# Patient Record
Sex: Female | Born: 1975 | Race: White | Hispanic: No | Marital: Married | Smoking: Never smoker
Health system: Southern US, Community
[De-identification: ages and names within clinical notes are randomized; demographics above are authoritative.]

---

## 2013-08-21 ENCOUNTER — Ambulatory Visit
Admission: RE | Admit: 2013-08-21 | Discharge: 2013-08-21 | Disposition: A | Discharge status: Home / Self Care | Payer: BC Managed Care – PPO | Source: Ambulatory Visit | Attending: Family Medicine | Admitting: Family Medicine

## 2013-08-21 ENCOUNTER — Other Ambulatory Visit: Payer: Self-pay | Admitting: Family Medicine

## 2013-08-21 DIAGNOSIS — M79651 Pain in right thigh: Secondary | ICD-10-CM

## 2013-08-21 DIAGNOSIS — M25551 Pain in right hip: Secondary | ICD-10-CM

## 2015-03-26 ENCOUNTER — Other Ambulatory Visit: Payer: Self-pay | Admitting: Family Medicine

## 2015-03-26 ENCOUNTER — Other Ambulatory Visit (HOSPITAL_COMMUNITY)
Admission: RE | Admit: 2015-03-26 | Discharge: 2015-03-26 | Disposition: A | Discharge status: Home / Self Care | Payer: BLUE CROSS/BLUE SHIELD | Source: Ambulatory Visit | Attending: Pediatrics | Admitting: Pediatrics

## 2015-03-26 DIAGNOSIS — Z01411 Encounter for gynecological examination (general) (routine) with abnormal findings: Secondary | ICD-10-CM | POA: Diagnosis present

## 2015-03-26 DIAGNOSIS — Z1151 Encounter for screening for human papillomavirus (HPV): Secondary | ICD-10-CM | POA: Diagnosis present

## 2015-03-29 LAB — CYTOLOGY - PAP

## 2015-05-14 DIAGNOSIS — Z3042 Encounter for surveillance of injectable contraceptive: Secondary | ICD-10-CM | POA: Diagnosis not present

## 2015-07-17 DIAGNOSIS — R030 Elevated blood-pressure reading, without diagnosis of hypertension: Secondary | ICD-10-CM | POA: Diagnosis not present

## 2015-07-17 DIAGNOSIS — N390 Urinary tract infection, site not specified: Secondary | ICD-10-CM | POA: Diagnosis not present

## 2015-08-06 DIAGNOSIS — Z3042 Encounter for surveillance of injectable contraceptive: Secondary | ICD-10-CM | POA: Diagnosis not present

## 2015-10-15 DIAGNOSIS — Z3042 Encounter for surveillance of injectable contraceptive: Secondary | ICD-10-CM | POA: Diagnosis not present

## 2015-12-24 DIAGNOSIS — Z3042 Encounter for surveillance of injectable contraceptive: Secondary | ICD-10-CM | POA: Diagnosis not present

## 2016-01-28 DIAGNOSIS — F411 Generalized anxiety disorder: Secondary | ICD-10-CM | POA: Diagnosis not present

## 2016-02-06 IMAGING — CR DG FEMUR 2+V*R*
4 series · 4 of 4 positions shown · non-contrast
Comparison: Right hip same day

CLINICAL DATA: RIGHT THIGH PAIN X 6 WEEKS  NO TRUAMA

EXAM:
RIGHT FEMUR - 2 VIEW

[view not recorded (1 of 4)]
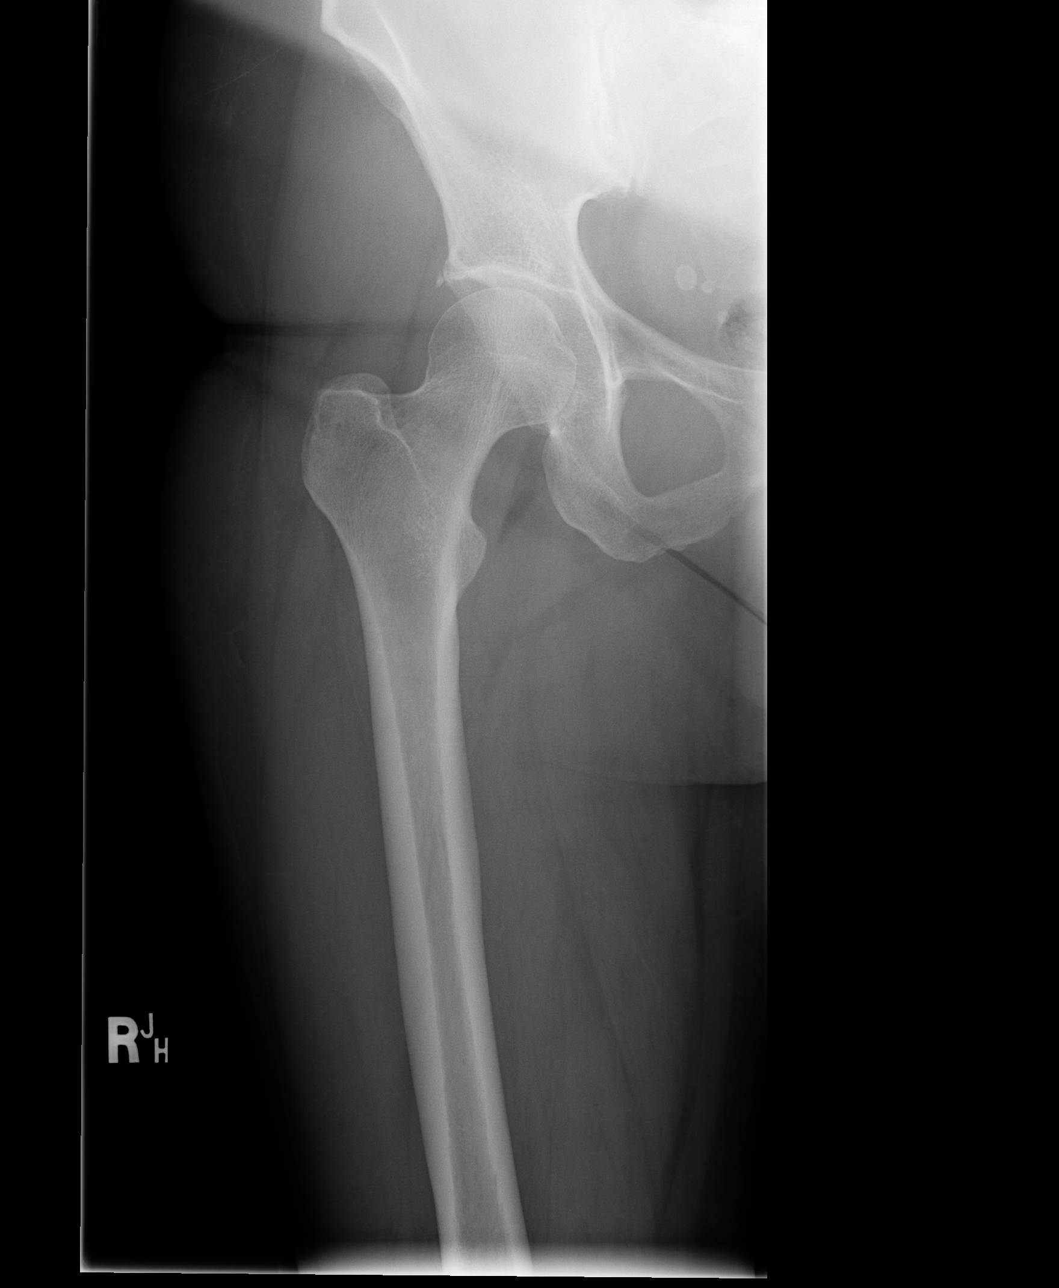

[view not recorded (2 of 4)]
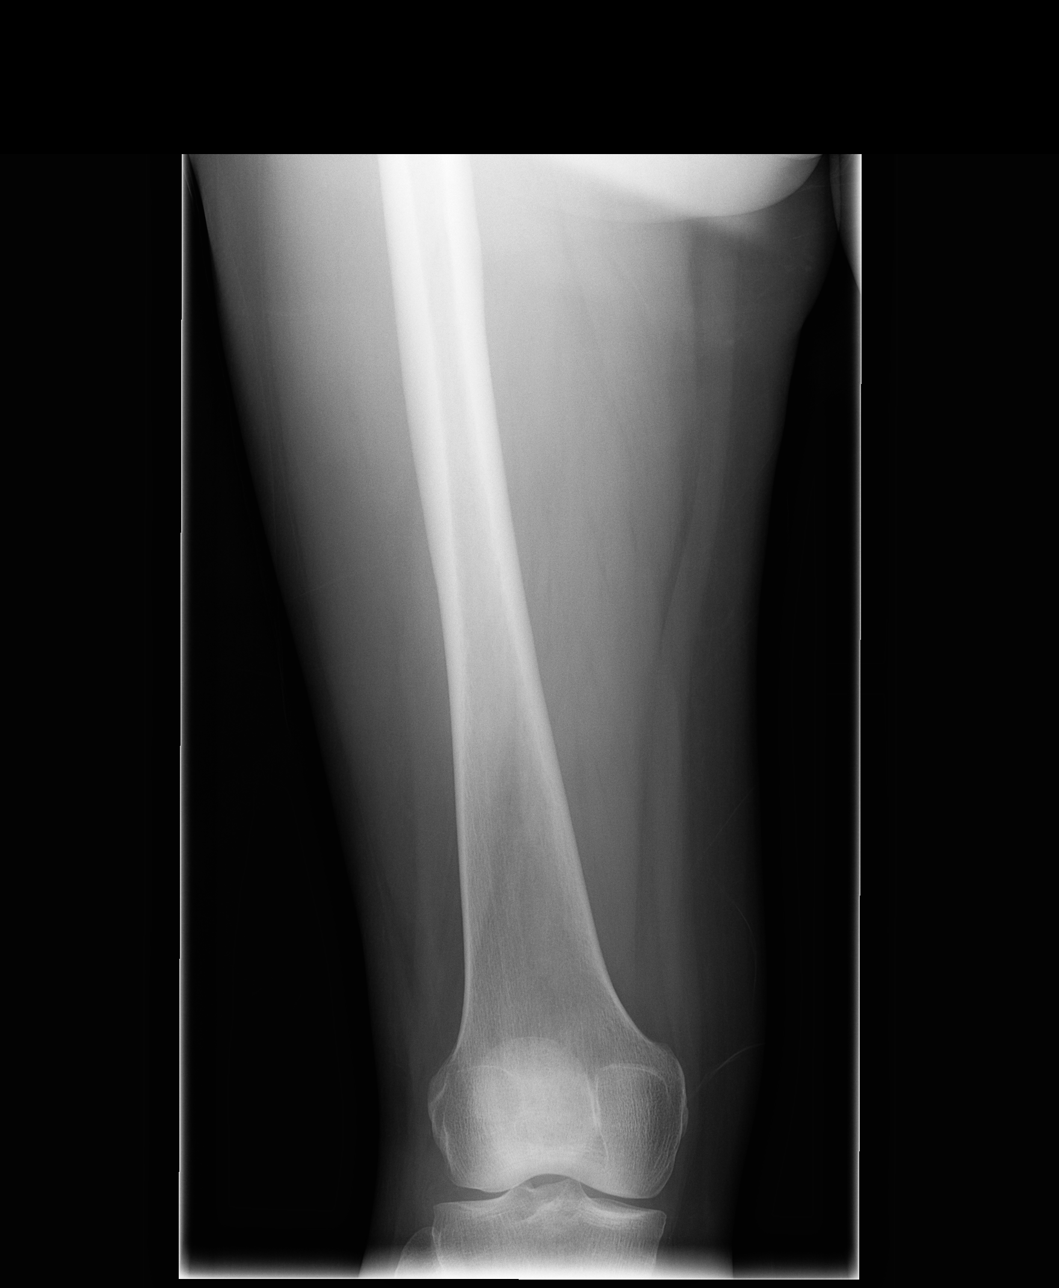

[view not recorded (3 of 4)]
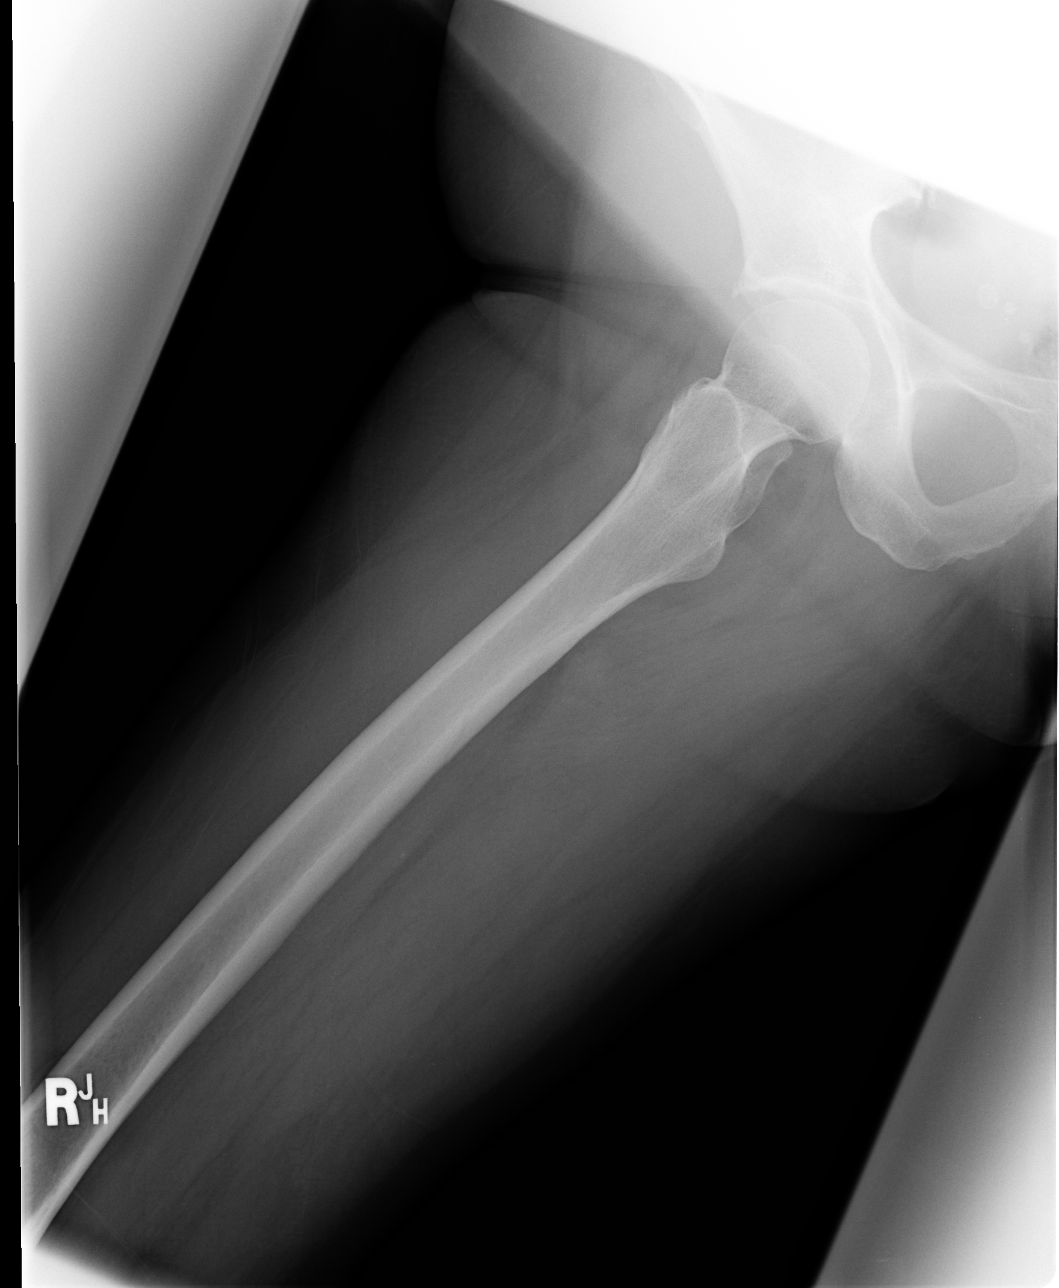

[view not recorded (4 of 4)]
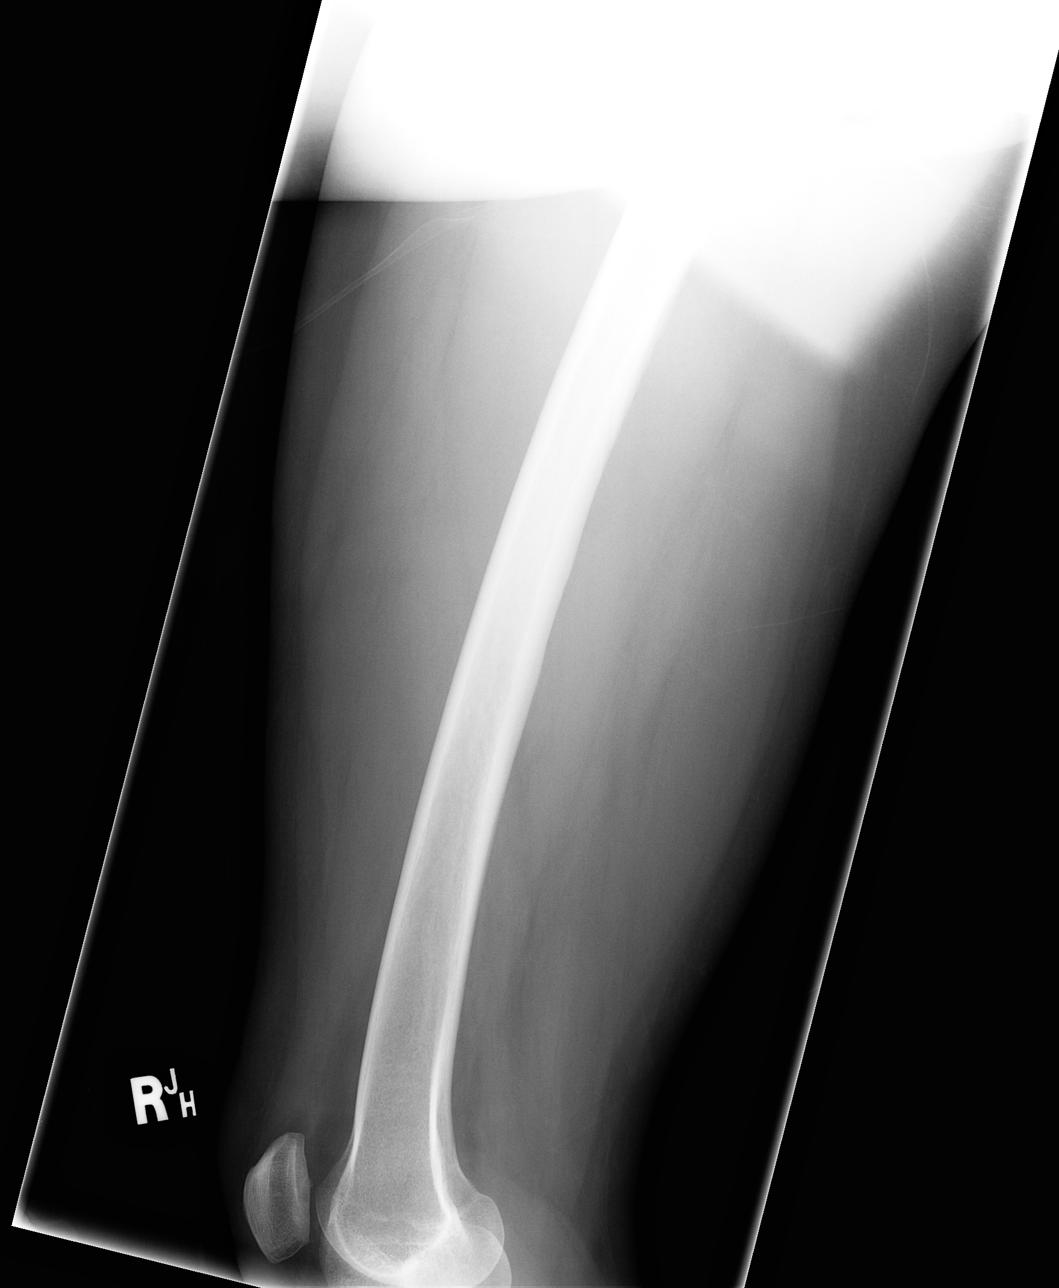

[4 of 4 positions shown; findings below may reference images not displayed]

FINDINGS: There is no evidence of fracture or other focal bone lesions. Soft
tissues are unremarkable.
IMPRESSION: Negative.

## 2016-03-06 DIAGNOSIS — Z3042 Encounter for surveillance of injectable contraceptive: Secondary | ICD-10-CM | POA: Diagnosis not present

## 2016-06-05 DIAGNOSIS — Z3042 Encounter for surveillance of injectable contraceptive: Secondary | ICD-10-CM | POA: Diagnosis not present

## 2016-08-18 DIAGNOSIS — Z3042 Encounter for surveillance of injectable contraceptive: Secondary | ICD-10-CM | POA: Diagnosis not present

## 2016-10-27 DIAGNOSIS — Z3042 Encounter for surveillance of injectable contraceptive: Secondary | ICD-10-CM | POA: Diagnosis not present

## 2017-01-22 DIAGNOSIS — Z3042 Encounter for surveillance of injectable contraceptive: Secondary | ICD-10-CM | POA: Diagnosis not present

## 2017-02-08 DIAGNOSIS — R300 Dysuria: Secondary | ICD-10-CM | POA: Diagnosis not present

## 2017-03-10 DIAGNOSIS — R399 Unspecified symptoms and signs involving the genitourinary system: Secondary | ICD-10-CM | POA: Diagnosis not present

## 2017-04-05 DIAGNOSIS — Z3042 Encounter for surveillance of injectable contraceptive: Secondary | ICD-10-CM | POA: Diagnosis not present

## 2017-05-19 DIAGNOSIS — Z91018 Allergy to other foods: Secondary | ICD-10-CM | POA: Diagnosis not present

## 2017-05-19 DIAGNOSIS — I10 Essential (primary) hypertension: Secondary | ICD-10-CM | POA: Diagnosis not present

## 2017-06-15 DIAGNOSIS — Z3042 Encounter for surveillance of injectable contraceptive: Secondary | ICD-10-CM | POA: Diagnosis not present

## 2017-08-31 DIAGNOSIS — Z3042 Encounter for surveillance of injectable contraceptive: Secondary | ICD-10-CM | POA: Diagnosis not present

## 2017-10-12 DIAGNOSIS — R399 Unspecified symptoms and signs involving the genitourinary system: Secondary | ICD-10-CM | POA: Diagnosis not present

## 2017-10-26 DIAGNOSIS — L84 Corns and callosities: Secondary | ICD-10-CM | POA: Diagnosis not present

## 2017-11-09 DIAGNOSIS — Z3042 Encounter for surveillance of injectable contraceptive: Secondary | ICD-10-CM | POA: Diagnosis not present

## 2017-11-10 DIAGNOSIS — B079 Viral wart, unspecified: Secondary | ICD-10-CM | POA: Diagnosis not present

## 2017-11-10 DIAGNOSIS — M79672 Pain in left foot: Secondary | ICD-10-CM | POA: Diagnosis not present

## 2017-11-26 DIAGNOSIS — B079 Viral wart, unspecified: Secondary | ICD-10-CM | POA: Diagnosis not present

## 2017-12-20 DIAGNOSIS — B079 Viral wart, unspecified: Secondary | ICD-10-CM | POA: Diagnosis not present

## 2018-01-21 DIAGNOSIS — Z3042 Encounter for surveillance of injectable contraceptive: Secondary | ICD-10-CM | POA: Diagnosis not present

## 2018-02-04 DIAGNOSIS — N390 Urinary tract infection, site not specified: Secondary | ICD-10-CM | POA: Diagnosis not present

## 2018-02-28 DIAGNOSIS — R030 Elevated blood-pressure reading, without diagnosis of hypertension: Secondary | ICD-10-CM | POA: Diagnosis not present

## 2018-02-28 DIAGNOSIS — N390 Urinary tract infection, site not specified: Secondary | ICD-10-CM | POA: Diagnosis not present

## 2018-02-28 DIAGNOSIS — J019 Acute sinusitis, unspecified: Secondary | ICD-10-CM | POA: Diagnosis not present

## 2018-04-08 DIAGNOSIS — Z3042 Encounter for surveillance of injectable contraceptive: Secondary | ICD-10-CM | POA: Diagnosis not present

## 2018-05-13 DIAGNOSIS — Z20828 Contact with and (suspected) exposure to other viral communicable diseases: Secondary | ICD-10-CM | POA: Diagnosis not present

## 2018-05-27 DIAGNOSIS — E7409 Other glycogen storage disease: Secondary | ICD-10-CM | POA: Diagnosis not present

## 2018-05-27 DIAGNOSIS — W57XXXA Bitten or stung by nonvenomous insect and other nonvenomous arthropods, initial encounter: Secondary | ICD-10-CM | POA: Diagnosis not present

## 2018-06-29 DIAGNOSIS — K580 Irritable bowel syndrome with diarrhea: Secondary | ICD-10-CM | POA: Diagnosis not present

## 2018-06-29 DIAGNOSIS — I10 Essential (primary) hypertension: Secondary | ICD-10-CM | POA: Diagnosis not present

## 2018-08-26 DIAGNOSIS — Z3042 Encounter for surveillance of injectable contraceptive: Secondary | ICD-10-CM | POA: Diagnosis not present

## 2018-09-22 DIAGNOSIS — L259 Unspecified contact dermatitis, unspecified cause: Secondary | ICD-10-CM | POA: Diagnosis not present

## 2018-09-22 DIAGNOSIS — K580 Irritable bowel syndrome with diarrhea: Secondary | ICD-10-CM | POA: Diagnosis not present

## 2018-09-22 DIAGNOSIS — F419 Anxiety disorder, unspecified: Secondary | ICD-10-CM | POA: Diagnosis not present

## 2018-10-05 DIAGNOSIS — F419 Anxiety disorder, unspecified: Secondary | ICD-10-CM | POA: Diagnosis not present

## 2018-11-04 DIAGNOSIS — Z3042 Encounter for surveillance of injectable contraceptive: Secondary | ICD-10-CM | POA: Diagnosis not present

## 2018-11-14 DIAGNOSIS — G4700 Insomnia, unspecified: Secondary | ICD-10-CM | POA: Diagnosis not present

## 2018-11-14 DIAGNOSIS — N3010 Interstitial cystitis (chronic) without hematuria: Secondary | ICD-10-CM | POA: Diagnosis not present

## 2018-11-14 DIAGNOSIS — I10 Essential (primary) hypertension: Secondary | ICD-10-CM | POA: Diagnosis not present

## 2018-11-14 DIAGNOSIS — F411 Generalized anxiety disorder: Secondary | ICD-10-CM | POA: Diagnosis not present

## 2018-11-30 DIAGNOSIS — I10 Essential (primary) hypertension: Secondary | ICD-10-CM | POA: Diagnosis not present

## 2018-11-30 DIAGNOSIS — F419 Anxiety disorder, unspecified: Secondary | ICD-10-CM | POA: Diagnosis not present

## 2018-11-30 DIAGNOSIS — Z9119 Patient's noncompliance with other medical treatment and regimen: Secondary | ICD-10-CM | POA: Diagnosis not present

## 2020-02-22 ENCOUNTER — Telehealth: Payer: Self-pay | Admitting: Medical

## 2020-02-22 NOTE — Telephone Encounter (Signed)
Requested records received from [facility]

## 2020-03-06 ENCOUNTER — Other Ambulatory Visit: Payer: Self-pay

## 2020-03-06 ENCOUNTER — Encounter: Payer: Self-pay | Admitting: Family Medicine

## 2020-03-06 ENCOUNTER — Telehealth (INDEPENDENT_AMBULATORY_CARE_PROVIDER_SITE_OTHER): Payer: 59 | Admitting: Family Medicine

## 2020-03-06 VITALS — BP 168/120 | HR 100 | Wt 125.0 lb

## 2020-03-06 DIAGNOSIS — R030 Elevated blood-pressure reading, without diagnosis of hypertension: Secondary | ICD-10-CM

## 2020-03-06 DIAGNOSIS — F411 Generalized anxiety disorder: Secondary | ICD-10-CM | POA: Diagnosis not present

## 2020-03-06 DIAGNOSIS — F429 Obsessive-compulsive disorder, unspecified: Secondary | ICD-10-CM | POA: Insufficient documentation

## 2020-03-06 DIAGNOSIS — J019 Acute sinusitis, unspecified: Secondary | ICD-10-CM | POA: Diagnosis not present

## 2020-03-06 DIAGNOSIS — F1020 Alcohol dependence, uncomplicated: Secondary | ICD-10-CM

## 2020-03-06 MED ORDER — AMOXICILLIN-POT CLAVULANATE 875-125 MG PO TABS: 1.0000 | ORAL_TABLET | Freq: Two times a day (BID) | ORAL | 0 refills | Status: DC

## 2020-03-06 NOTE — Patient Instructions (Signed)
You can call to schedule your appointment with the psychiatrist/counselor. A few offices are listed below for you to call.    [facility]  Ask for a psychiatrist  [address]  (across from [facility])  [phone]    [facility] [address] [place], [place] [address] [phone]   [facility]  [address], [place], [place] [address]  Phone: [phone]   [facility] [address] [place], [place] [address]  Phone: [phone] 

## 2020-03-06 NOTE — Progress Notes (Signed)
Subjective:  Documentation for virtual audio and video telecommunications through Caregility encounter:  The patient was located at home. 2 patient identifiers used.  The provider was located in the office. The patient did consent to this visit and is aware of possible charges through their insurance for this visit.  The other persons participating in this telemedicine service were none. Time spent on call was 45 minutes and in review of previous records >45 minutes total.  This virtual service is not related to other E/M service within previous 7 days.   Patient ID: Nicole Lowe, female    DOB: Feb 06, 1975, 45 y.o.   MRN: [id number]  HPI . Chief Complaint  Patient presents with  . new acute cold   . Nasal Congestion  . Sinus Problem  . Fatigue  . Generalized Body Aches   She is new to the practice and here to establish care.  The visit was originally scheduled as an in office visit but changed based on her current symptoms in the setting of Covid 19 pandemic.   Previous medical care: [facility]   Other providers: None   Complains of a 2 week history of fatigue, body aches, sinus pressure, nasal congestion, frontal headache, and watery eyes.  States she thinks she has a sinus infection.  States she needs an antibiotic and would like a steroid Dosepak as well. Last sinus infection was last year.  Mild underlying allergies.   She has taken Dayquil.  She declines Covid testing.  States she had Covid approximately 1 year ago and she was very sick.  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   Reports history of anxiety, panic attacks and OCD. States she has been taking alprazolam daily for years and recently started on buspirone.  States she is happy with how she is currently taking her medications. States she is in a good place  mentally and emotionally. She has been taking alprazolam 1.5 mg twice daily.  States she has been taking this dose for at least 2 years.  States she used to be on an SSRI and refuses to to be on an antidepressant again.  States she does not like how they make her feel. She repeatedly verbalizes that she does not want to be on a antidepressant.  She also admits to drinking 3-4 alcoholic drinks per night.  States she is "cutting back"  States she has seen a therapist in the past and is not currently involved in counseling.  She has never seen a psychiatrist.  States she lost her 2 pets last year and she is still struggling with this emotionally.  She does have a new dog now.  Blood pressure significantly elevated per patient.  She did not come in the office for [place] to check this today.  She does not want to discuss this today.  Denies dizziness, vision changes, chest pain, palpitations, shortness of breath.  She is getting Depo Provera injections for contraception.  She would like to come in for this when she is due.  History of alpha gal    Social history: Lives with spouse, works as Community education officer  Denies smoking and stopped 20 years ago   Reviewed allergies, medications, past medical, surgical, family, and social history.    Review of Systems Pertinent positives and negatives in the history of present illness.     Objective:  Physical Exam BP (!) 168/120   Pulse 100   Wt 125 lb (56.7 kg)   Alert and oriented and in no acute distress.  Respirations unlabored.  Speaking in complete senses without difficulty.  Reports tenderness to palpation to her maxillary and ethmoid sinuses.  Normal speech.  Her mood did become anxious when discussing her medication regimen.      Assessment & Plan:  GAD (generalized anxiety disorder) -She is new to the practice and here today with several complaints including an acute illness, suspected sinusitis. Her visit was switched to a  virtual. She reportedly has a long history of anxiety disorder, panic attacks and OCD.  She also reports being on alprazolam daily for years.  She is also using this for sleep. Discussed that I will not continue her alprazolam prescription as it is and that I recommend that she begin to wean off of this medication.  Discussed potential harmful interaction with alprazolam and her daily alcohol use.  Discussed potential long-term health consequences associated with daily benzodiazepine use.  I provided her with options including seeing a psychiatrist or finding a new PCP. I agreed to help her wean off of the alprazolam if she follows up with me.  She then reported that she recently established with a new PCP and they refused to prescribe her alprazolam at all and wanted her to see a psychiatrist but she refused.   States she is willing to check into finding a psychiatrist.  I provided her with a list. She repeatedly tell me how she feels comfortable with her current medication regimen and that she would not be interested in taking a daily antidepressant.  In-depth counseling done on why she should wean off of the alprazolam and why she should not use this medication for sleep along with the alcohol. If she follows up with me then, she is aware that her prescription of alprazolam will be reduced going forward.  Review of PDMP shows that she received a new prescription from her previous PCP on March 03, 2020. No prescriptions provided today except for an antibiotic. I am not certain that she will follow-up with me but if she does, she is aware of my plan to wean her off of the alprazolam   Obsessive-compulsive disorder, unspecified type -Referral to psychiatry.  I also recommend counseling.  Acute non-recurrent sinusitis, unspecified location - Plan: amoxicillin-clavulanate (AUGMENTIN) 875-125 MG tablet -Declines Covid testing.  I will treat her for acute sinusitis.  She requests a steroid Dosepak  however, discussed that I do not think this is necessary at this point.  I will prescribe an antibiotic for her.  Discussed symptomatic treatment including Mucinex, good hydration.  She may consider nasal spray or decongestant as well if needed.  Elevated blood pressure reading -Unclear as to what her blood pressure would be in the office.  This will need follow-up as soon as she is feeling better.  She did not want to discuss this today.  Alcohol dependence, daily use (HCC) -She reports that she is in the process of cutting back.  Discussed that alcohol can negatively affect her mood as well as having a negative interaction with alprazolam and BuSpar.  More than 45 minutes was spent on a virtual audio and video call with patient and 100% of this time was spent in counseling and coordination of care.  I also spent time reviewing medical records from her previous PCP.

## 2020-03-27 ENCOUNTER — Telehealth: Payer: Self-pay | Admitting: Family Medicine

## 2020-03-27 ENCOUNTER — Other Ambulatory Visit: Payer: Self-pay | Admitting: Family Medicine

## 2020-03-27 NOTE — Telephone Encounter (Signed)
I reviewed her chart and my notes from our visit to see how I could help her and found that she picked up a prescription of alprazolam 90 tablets on 03/03/2020 and it appears that she was prescribed more alprazolam on 03/12/2020 from a different PCP at [facility]. Unfortunately, I am unable to prescribe anything else for her today. Did she find a Veterinary surgeon or psychiatrist as we discussed? I provided her with a list. In order for her to get immediate help today, I recommend she go to the [facility] on [address] or the [facility] ED to be evaluated. Thanks.

## 2020-03-27 NOTE — Telephone Encounter (Signed)
Pt called having panic attack, I talked with her for 20 minutes, she said it felt like her heart was going to jump out of her chest. Her husband is also having depression issues.  They lost two dogs within 6 month.  She was at [facility] walking around.  Pt wants to know if you can call her in something this evening to help calm her down.  [facility] [address] 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  Pt ph [phone]

## 2020-03-27 NOTE — Telephone Encounter (Signed)
Called pt back, reached voice mail. LMTRC
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
## 2020-04-02 ENCOUNTER — Other Ambulatory Visit (INDEPENDENT_AMBULATORY_CARE_PROVIDER_SITE_OTHER): Payer: 59

## 2020-04-02 ENCOUNTER — Other Ambulatory Visit: Payer: Self-pay

## 2020-04-02 DIAGNOSIS — Z309 Encounter for contraceptive management, unspecified: Secondary | ICD-10-CM

## 2020-04-02 MED ORDER — MEDROXYPROGESTERONE ACETATE 150 MG/ML IM SUSP
150.0000 mg | Freq: Once | INTRAMUSCULAR | Status: AC
Start: 2020-04-02 — End: 2020-04-02
  Administered 2020-04-02: 150 mg via INTRAMUSCULAR

## 2020-06-25 ENCOUNTER — Other Ambulatory Visit: Payer: 59

## 2021-03-12 ENCOUNTER — Ambulatory Visit: Payer: 59 | Admitting: Podiatry

## 2021-03-12 ENCOUNTER — Ambulatory Visit (INDEPENDENT_AMBULATORY_CARE_PROVIDER_SITE_OTHER): Payer: 59

## 2021-03-12 ENCOUNTER — Other Ambulatory Visit: Payer: Self-pay

## 2021-03-12 DIAGNOSIS — M21612 Bunion of left foot: Secondary | ICD-10-CM

## 2021-03-12 DIAGNOSIS — M21619 Bunion of unspecified foot: Secondary | ICD-10-CM

## 2021-03-12 DIAGNOSIS — M21611 Bunion of right foot: Secondary | ICD-10-CM | POA: Diagnosis not present

## 2021-03-12 DIAGNOSIS — M7751 Other enthesopathy of right foot: Secondary | ICD-10-CM

## 2021-03-12 MED ORDER — TRIAMCINOLONE ACETONIDE 10 MG/ML IJ SUSP
10.0000 mg | Freq: Once | INTRAMUSCULAR | Status: AC
  Administered 2021-03-12: 10 mg

## 2021-03-12 NOTE — Patient Instructions (Signed)

## 2021-03-13 NOTE — Progress Notes (Signed)
Subjective:  ? ?Patient ID: Nicole Lowe, female   DOB: 46 y.o.   MRN: [id number]  ? ?HPI ?Patient states that she has developed progressive pain around her big toe joint right over left over the last approximate 1 year.  It is worse with certain activities especially hiking and certain types of shoes and also develops callus formation.  She knows she has bunions and does have family history and does not smoke likes to be active ? ? ?Review of Systems  ?All other systems reviewed and are negative. ? ? ?   ?Objective:  ?Physical Exam ?Vitals and nursing note reviewed.  ?Constitutional:   ?   Appearance: She is well-developed.  ?Pulmonary:  ?   Effort: Pulmonary effort is normal.  ?Musculoskeletal:     ?   General: Normal range of motion.  ?Skin: ?   General: Skin is warm.  ?Neurological:  ?   Mental Status: She is alert.  ?  ?Neurovascular status was found to be intact muscle strength was found to be adequate range of motion within normal limits.  Patient does have structural HAV deformity right over left with redness around the joint and on the right when there is more swelling dorsally with mild limitation of motion but no crepitus of the joint.  Patient is found to have good digital perfusion well oriented x3 ? ?   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 ?   ?Plan:  ?H&P reviewed condition and educated her on bunion deformity and also the possibility for low-grade arthritis.  At this point, to try to reduce inflammation and I did advise at 1 point this bunion should be fixed and I do not want to wait forever and hopefully we can avoid more cartilage damage.  Today I did a periarticular injection 3 mg Kenalog 5 mg Xylocaine around the first MPJ and I want to check her back again in 1 month and reevaluate.  Patient will wear rigid bottom shoes as best as  possible and can do her hiking and other activities at this time ? ?X-rays indicate structural bunion deformity right over left with signs of arthritis of the mild structural deformity right over left dorsally and within the joint of the MPJ ?   ? ? ?

## 2021-03-20 ENCOUNTER — Other Ambulatory Visit: Payer: Self-pay

## 2021-03-20 ENCOUNTER — Ambulatory Visit: Payer: 59 | Admitting: Podiatry

## 2021-03-20 DIAGNOSIS — M205X1 Other deformities of toe(s) (acquired), right foot: Secondary | ICD-10-CM | POA: Diagnosis not present

## 2021-03-20 DIAGNOSIS — M21611 Bunion of right foot: Secondary | ICD-10-CM | POA: Diagnosis not present

## 2021-03-20 NOTE — Progress Notes (Signed)
Subjective:  ? ?Patient ID: Nicole Lowe, female   DOB: 46 y.o.   MRN: [id number]  ? ?HPI ?Patient presents stating her big toe joint did great after we did the injection for several days but then the pain started back and is now returned to the way it was and while she noticed a reduction in pain in all her other areas it also returned in the outside of the foot and the top of the foot.  Patient is interested in trying to do something to correct this as she did have a big improvement over that.  Of time of those few days ? ? ?ROS ? ? ?   ?Objective:  ?Physical Exam  ?Neurovascular status found to be intact with the patient found to have reduced range of motion of the first MPJ structural bunion deformity and pain of the first MPJ.  She also has mild discomfort lateral around the peroneal groove and into the sinus tarsi with slight dorsal pain but those are not to the same degree of what she is experiencing in the big toe joint and is most likely compensating for that pain ? ?   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 ?   ?Plan:  ?H&P x-rays reviewed and condition discussed with Nicole Lowe at great length.  I did discuss correcting the bunion evaluating the cartilage removal of dorsal spurring and also somewhat lowering and shortening the first metatarsal with osteotomy.  Patient wants to pursue this and at this point I allowed her to read consent form going over alternative treatments complications.  Patient understand there is absolutely no long-term guarantees that this will solve the problem that ultimately she may require fusion or joint implantation and that the outside pain is most likely compensatory in nature and I am hopeful it will go away but there is no guarantee that pain will go away.  She understands all of this she wants surgery signed  consent form and is scheduled for a osteotomy first metatarsal right with removal of bone spurs and everything was discussed with her and I also reviewed with her recovery of approximate 6 months for complete recovery.  Air fracture walker dispensed today and patient will start wearing this at home to get used to it prior to surgery and find a shoe on the other foot that fits well.  Patient is scheduled for outpatient surgery all questions answered and is given her date ?   ? ? ?

## 2021-03-26 ENCOUNTER — Other Ambulatory Visit: Payer: Self-pay | Admitting: Sports Medicine

## 2021-03-26 ENCOUNTER — Telehealth: Payer: Self-pay | Admitting: Sports Medicine

## 2021-03-26 MED ORDER — ACETAMINOPHEN-CODEINE #3 300-30 MG PO TABS: 1.0000 | ORAL_TABLET | Freq: Three times a day (TID) | ORAL | 0 refills | Status: DC | PRN

## 2021-03-26 NOTE — Telephone Encounter (Signed)
Patient is calling in requesting pain medication , she is in a lot of pain, wants to know if you can call her in a high dose of Tylenol? Please advise   [facility] [address]

## 2021-03-26 NOTE — Telephone Encounter (Signed)
Pt returned call and I told her the medication tylenol 3 was called into the pharmacy. She asked about if it persisted on the message to call us and get an appt. ?

## 2021-03-26 NOTE — Telephone Encounter (Signed)
Lmovm that medication was called into pharmacy

## 2021-03-26 NOTE — Progress Notes (Signed)
Tylenol 3 sent for patient for severe pain ?

## 2021-03-31 ENCOUNTER — Telehealth: Payer: Self-pay | Admitting: Urology

## 2021-03-31 NOTE — Telephone Encounter (Signed)
DOS - 04/29/21 ? ?[place] BUNIONECTOMY RIGHT --- [phone] ? ? ?[employer] EFFECTIVE DATE - 01/05/21 ? ? ?RECEIVED FAX FROM [employer] STATING THAT CPT CODE 60454 HAS BEEN APPROVED, AUTH # [id number], GOOD FROM 03/31/21 - 07/01/21.  ?

## 2021-04-02 ENCOUNTER — Other Ambulatory Visit: Payer: Self-pay | Admitting: Sports Medicine

## 2021-04-02 ENCOUNTER — Ambulatory Visit: Payer: 59 | Admitting: Podiatry

## 2021-04-02 ENCOUNTER — Telehealth: Payer: Self-pay | Admitting: Podiatry

## 2021-04-02 NOTE — Telephone Encounter (Signed)
Pt called asking if we could give her refills on her pain medication(Tylenol with codine)  ? ?I explained that a rx was sent in today and we cannot put refills on pain medication she will have to call to get it refilled when she is almost out. ?

## 2021-04-02 NOTE — Telephone Encounter (Signed)
Please advise 

## 2021-04-16 ENCOUNTER — Telehealth: Payer: Self-pay | Admitting: Podiatry

## 2021-04-16 ENCOUNTER — Ambulatory Visit: Payer: 59 | Admitting: Podiatry

## 2021-04-16 NOTE — Telephone Encounter (Signed)
Lmom letting patient know that Dr Paulla Dolly wants her to switch to ibuprofen, that he does not recommend narcotics for long time use.

## 2021-04-16 NOTE — Telephone Encounter (Signed)
Ph[facility] : [facility] [address]   Medication Acetaminophen-Codeine 300-30 MG tablet   Patient is requesting a refill on pain medication.

## 2021-04-16 NOTE — Telephone Encounter (Signed)
Pt called back and is aware that Dr Charlsie Merles recommended taking ibuprofen for now. He does not want her taking narcotics for a long period of time.  ?

## 2021-04-16 NOTE — Telephone Encounter (Signed)
Should be ok with ibuprophen. Do not recommend narcotics for this long of a time

## 2021-04-28 MED ORDER — OXYCODONE-ACETAMINOPHEN 10-325 MG PO TABS
1.0000 | ORAL_TABLET | ORAL | 0 refills | Status: DC | PRN
Start: 2021-04-28 — End: 2021-10-29

## 2021-04-28 MED ORDER — ONDANSETRON HCL 4 MG PO TABS: 4.0000 mg | ORAL_TABLET | Freq: Three times a day (TID) | ORAL | 0 refills | Status: DC | PRN

## 2021-04-28 NOTE — Addendum Note (Signed)
Addended by: Lenn Sink on: 04/28/2021 01:24 PM ? ? Modules accepted: Orders ? ?

## 2021-04-29 ENCOUNTER — Other Ambulatory Visit: Payer: Self-pay | Admitting: Podiatry

## 2021-04-29 ENCOUNTER — Telehealth: Payer: Self-pay | Admitting: Podiatry

## 2021-04-29 ENCOUNTER — Encounter: Payer: Self-pay | Admitting: Podiatry

## 2021-04-29 DIAGNOSIS — M2011 Hallux valgus (acquired), right foot: Secondary | ICD-10-CM | POA: Diagnosis not present

## 2021-04-29 MED ORDER — OXYCODONE-ACETAMINOPHEN 10-325 MG PO TABS
1.0000 | ORAL_TABLET | Freq: Four times a day (QID) | ORAL | 0 refills | Status: DC | PRN
Start: 2021-04-29 — End: 2021-10-29

## 2021-04-29 NOTE — Telephone Encounter (Signed)
Pt called again she had surgery today and has not been able to get her pain medication yet and she needs it tonight her anesthesia is wearing off. ? ?I seen that it said it needed a prior auth and those do take time.. Please let pt know what is going on..... ?

## 2021-04-29 NOTE — Telephone Encounter (Signed)
Called the pharmacy and the insurance will not allow, too many tablets within 24 hour period , instead of q4 hours,q6 hours may qualify to dispense. Please resend script.  ?

## 2021-04-29 NOTE — Telephone Encounter (Signed)
Pt called again regarding her medication. States that her insurance requires a prior authorization for the narcotic. She states that she would like to have the medication so that she is not in pain over night tonight.  ? ?Please advise ?

## 2021-04-29 NOTE — Telephone Encounter (Signed)
Notified pt that Dr Allena Katz is working on getting the rx resent. ?

## 2021-04-29 NOTE — Telephone Encounter (Signed)
Pt husband called about her pain medication (oxye) not approved per pharmacy. Pt in pain. ? ?Please advise. ?

## 2021-04-30 NOTE — Telephone Encounter (Signed)
Called patient and she was able to pick up medicine yesterday after dosage change was made. ?

## 2021-04-30 NOTE — Telephone Encounter (Signed)
Please call her this morning to confirm she received the medicine. Not sure why it requires preauthorization

## 2021-05-02 ENCOUNTER — Telehealth: Payer: Self-pay | Admitting: Podiatry

## 2021-05-02 NOTE — Telephone Encounter (Signed)
Pt states that she is doing well after her surgery. Wants to know if its okay to take her boot off from time to time during the day and night. She has been wearing it all day and night since sx. ? ?Please advise. ?

## 2021-05-02 NOTE — Telephone Encounter (Signed)
That is fine 

## 2021-05-05 ENCOUNTER — Ambulatory Visit (INDEPENDENT_AMBULATORY_CARE_PROVIDER_SITE_OTHER): Payer: 59

## 2021-05-05 ENCOUNTER — Encounter: Payer: 59 | Admitting: Podiatry

## 2021-05-05 ENCOUNTER — Encounter: Payer: Self-pay | Admitting: Podiatry

## 2021-05-05 ENCOUNTER — Ambulatory Visit (INDEPENDENT_AMBULATORY_CARE_PROVIDER_SITE_OTHER): Payer: 59 | Admitting: Podiatry

## 2021-05-05 DIAGNOSIS — M7751 Other enthesopathy of right foot: Secondary | ICD-10-CM | POA: Diagnosis not present

## 2021-05-05 DIAGNOSIS — Z9889 Other specified postprocedural states: Secondary | ICD-10-CM

## 2021-05-07 ENCOUNTER — Encounter: Payer: 59 | Admitting: Podiatry

## 2021-05-07 NOTE — Progress Notes (Signed)
Subjective:  ? ?Patient ID: Nicole Lowe, female   DOB: 46 y.o.   MRN: [id number]  ? ?HPI ?Patient states she is doing very well with her foot very pleased with minimal discomfort able to walk with her boot without pain currently ? ? ?ROS ? ? ?   ?Objective:  ?Physical Exam  ?Neurovascular status intact negative Denna Haggard' sign noted wound edges coapted well range of motion first MPJ good no crepitus of the joint noted ? ?   ?Assessment:  ?Doing well post distal osteotomy first metatarsal right foot ? ?   ?Plan:  ?H&P x-rays reviewed and advised on continued immobilization elevation compression sterile dressing reapplied dispensed surgical shoe explained range of motion exercises.  Reappoint 3 weeks or earlier if needed ? ?X-rays indicate good plantarflexion first metatarsal reduction of IM angle with joint congruence ?   ? ? ?

## 2021-05-08 ENCOUNTER — Other Ambulatory Visit: Payer: Self-pay | Admitting: Podiatry

## 2021-05-08 ENCOUNTER — Telehealth: Payer: Self-pay | Admitting: Podiatry

## 2021-05-08 MED ORDER — OXYCODONE-ACETAMINOPHEN 10-325 MG PO TABS
1.0000 | ORAL_TABLET | ORAL | 0 refills | Status: DC | PRN
Start: 2021-05-08 — End: 2021-10-29

## 2021-05-08 NOTE — Telephone Encounter (Signed)
done

## 2021-05-08 NOTE — Telephone Encounter (Signed)
Pt called to request a refill on RX for Oxycodone. She stated she is still experiencing some pain when walking. She uses the pharmacy on [address]. Please advise ?

## 2021-05-09 ENCOUNTER — Telehealth: Payer: Self-pay | Admitting: *Deleted

## 2021-05-09 ENCOUNTER — Telehealth: Payer: Self-pay | Admitting: Podiatry

## 2021-05-09 NOTE — Telephone Encounter (Signed)
Ammie, please send this to pharmacy

## 2021-05-09 NOTE — Telephone Encounter (Addendum)
Patient called again said to hold off on this until she call the pharmacy and see if they are going to fill the amount that was called in.  If she calls back she is going to need a plan B medication .     Patient wants to know if you can call her in a different type pain medication since her insurance will not fill the other one?

## 2021-05-09 NOTE — Telephone Encounter (Signed)
Called pharmacy and the patient's plan will only cover 30 tablets within 180 days,she has passed her limit.  ?Prior authorization has been resubmitted to [employer], waiting for their response. Is there something else that can be prescribed?  ? ?Called and spoke the [employer] [facility]([employer] for prior authorization dept.). The prescription quantity would need to  be changed from 15 tablets to 12 tablets. It should go thru with no problem. ?

## 2021-05-09 NOTE — Telephone Encounter (Signed)
Good Morning Patient called and stated that she needs a Prior Auth sent to her pharmacy for the oxyCODONE-acetaminophen (PERCOCET) 10-325 MG tablet that was prescribed on 05/08/2021. ? ? ?Please advise  ?

## 2021-05-12 ENCOUNTER — Other Ambulatory Visit: Payer: Self-pay | Admitting: Podiatry

## 2021-05-12 MED ORDER — OXYCODONE-ACETAMINOPHEN 10-325 MG PO TABS: 1.0000 | ORAL_TABLET | ORAL | 0 refills | Status: DC | PRN

## 2021-05-12 NOTE — Telephone Encounter (Signed)
Called in 12

## 2021-05-13 ENCOUNTER — Telehealth: Payer: Self-pay | Admitting: Podiatry

## 2021-05-13 NOTE — Telephone Encounter (Signed)
Notified the pharmacy. 

## 2021-05-13 NOTE — Telephone Encounter (Signed)
Pt called is still in some pain but is asking when should she be able to transition into a regular shoe. She is aware Dr Charlsie Merles is not in the office today so it maybe tomorrow before she hears back.Marland Kitchen ?

## 2021-05-14 NOTE — Telephone Encounter (Signed)
Notified pt of what Dr Charlsie Merles said about wearing a regular shoe. She said thank you that answered her question. ?

## 2021-05-14 NOTE — Telephone Encounter (Signed)
Possisbly by weekend after this. Will still have swelling but may be able to get on tennis shoe

## 2021-05-20 ENCOUNTER — Telehealth: Payer: Self-pay | Admitting: Podiatry

## 2021-05-20 NOTE — Telephone Encounter (Signed)
PT wanting to know when can she stop wrapping her R foot? ?And when can she start doing small walks? ? ?Please advise ?

## 2021-05-21 NOTE — Telephone Encounter (Signed)
She can start small walks now wearing her surgical shoe or boot. Ok to wrap the foot at times

## 2021-05-23 ENCOUNTER — Telehealth: Payer: Self-pay | Admitting: Podiatry

## 2021-05-23 NOTE — Telephone Encounter (Signed)
No problem to go

## 2021-05-23 NOTE — Telephone Encounter (Signed)
Pt wants to know if its okay to make a day trip (1 hr 40 mins one way in car) with her surgical shoe on next week. She just wanted your recommendations.   A detailed message can be left on voice mail if she is unable to answer.  Please advise.

## 2021-05-23 NOTE — Telephone Encounter (Signed)
Left a detailed message on voice mail to inform her that's its okay to go out of town in her surgical shoe. Pt is aware.

## 2021-05-26 ENCOUNTER — Encounter: Payer: 59 | Admitting: Podiatry

## 2021-05-29 ENCOUNTER — Ambulatory Visit (INDEPENDENT_AMBULATORY_CARE_PROVIDER_SITE_OTHER): Payer: 59 | Admitting: Podiatry

## 2021-05-29 ENCOUNTER — Ambulatory Visit (INDEPENDENT_AMBULATORY_CARE_PROVIDER_SITE_OTHER): Payer: 59

## 2021-05-29 ENCOUNTER — Encounter: Payer: Self-pay | Admitting: Podiatry

## 2021-05-29 DIAGNOSIS — Z9889 Other specified postprocedural states: Secondary | ICD-10-CM

## 2021-05-29 DIAGNOSIS — M2011 Hallux valgus (acquired), right foot: Secondary | ICD-10-CM | POA: Diagnosis not present

## 2021-05-29 NOTE — Progress Notes (Signed)
Subjective:   Patient ID: Nicole Lowe, female   DOB: 46 y.o.   MRN: [id number]   HPI Patient presents stating overall doing very well please but admits she has been very active and has been doing yard work and not wearing her surgical shoe for her.  This time neuro   ROS      Objective:  Physical Exam  Vascular status found to be intact negative Denna Haggard' sign noted right foot healing well wound edges well coapted good range of motion no crepitus of the joint mild swelling around the first MPJ     Assessment:  Patient who has been excessively active on the foot who may have some stress on the osteotomy secondary to activity     Plan:  Reviewed condition and x-ray and I did discuss I want her to be careful for the next 3 to 4 weeks that I do think it will heal uneventfully at this position but there has been some stress on the osteotomy due to activity.  Dispensed ankle compression stocking advised on continued ice elevation and anti-inflammatories as needed reappoint 4 weeks  X-rays indicate there is some stress on the osteotomy site with signs of some secondary bone healing but overall I do think will heal uneventfully here in the joint is congruent [place]

## 2021-06-26 ENCOUNTER — Ambulatory Visit (INDEPENDENT_AMBULATORY_CARE_PROVIDER_SITE_OTHER): Payer: 59

## 2021-06-26 ENCOUNTER — Ambulatory Visit (INDEPENDENT_AMBULATORY_CARE_PROVIDER_SITE_OTHER): Payer: 59 | Admitting: Podiatry

## 2021-06-26 ENCOUNTER — Encounter: Payer: Self-pay | Admitting: Podiatry

## 2021-06-26 DIAGNOSIS — Z9889 Other specified postprocedural states: Secondary | ICD-10-CM | POA: Diagnosis not present

## 2021-06-26 NOTE — Progress Notes (Signed)
Subjective:   Patient ID: Nicole Lowe, female   DOB: 46 y.o.   MRN: [id number]   HPI Patient states she has been doing much better with her right foot and states the pain has reduced quite substantially with swelling also reduced   ROS      Objective:  Physical Exam  Ocular status intact negative Denna Haggard' sign noted right foot healing well wound edges well coapted no crepitus within the joint good range of motion     Assessment:  Overall appears to be doing well patient did have excessive activity on this and had some movement of the osteotomy site but it appears to have consolidated with good clinical findings     Plan:  H&P x-ray reviewed patient may gradually increase activity over the next 2 to 4 weeks and slowly change shoe gear but no jumping on her foot and will be seen back 6 weeks final visit  X-rays indicate there is been some stress on the osteotomy but it appears stable now with fixation in place

## 2021-08-06 ENCOUNTER — Ambulatory Visit (INDEPENDENT_AMBULATORY_CARE_PROVIDER_SITE_OTHER): Payer: Commercial Managed Care - HMO

## 2021-08-06 ENCOUNTER — Ambulatory Visit (INDEPENDENT_AMBULATORY_CARE_PROVIDER_SITE_OTHER): Payer: 59 | Admitting: Podiatry

## 2021-08-06 ENCOUNTER — Encounter: Payer: Self-pay | Admitting: Podiatry

## 2021-08-06 DIAGNOSIS — Z9889 Other specified postprocedural states: Secondary | ICD-10-CM

## 2021-08-06 DIAGNOSIS — M7751 Other enthesopathy of right foot: Secondary | ICD-10-CM

## 2021-08-06 MED ORDER — MELOXICAM 15 MG PO TABS: 15.0000 mg | ORAL_TABLET | Freq: Every day | ORAL | 2 refills | Status: AC

## 2021-08-06 NOTE — Progress Notes (Signed)
Subjective:   Patient ID: Nicole Lowe, female   DOB: 46 y.o.   MRN: [id number]   HPI Patient presents stating that she is doing well but she gets some mild discomfort with swelling if she walks too much and she is up to at least 3 miles walking   ROS      Objective:  Physical Exam  Neurovascular status intact negative Denna Haggard' sign noted wound edges coapted well hallux is in rectus position I did not note any crepitus in the joint with good range of motion slightly restricted still but still having some swelling from the healing process with mild discomfort around the second metatarsal phalangeal joint and into the digit     Assessment:  Still healing from surgery and patient is aggressive with her walking pattern with low-grade forefoot inflammation present     Plan:  Reviewed x-ray and hopefully all the symptoms will resolve over time and I am placing her on Mobic daily and I dispensed metatarsal pads to take pressure off the metatarsals.  I did explain there could be some increased pressure on the lesser MPJ secondary to the realignment of the bone but at this point it is too early to make that kind of a determination.  Reappoint to recheck 6 weeks or earlier if needed  X-rays indicate that there has been some secondary healing of the first metatarsal as this was a very difficult correction but I do think overall it is healing okay and should be uneventful long-term

## 2021-09-17 ENCOUNTER — Ambulatory Visit (INDEPENDENT_AMBULATORY_CARE_PROVIDER_SITE_OTHER): Payer: Commercial Managed Care - HMO | Admitting: Podiatry

## 2021-09-17 DIAGNOSIS — M7751 Other enthesopathy of right foot: Secondary | ICD-10-CM

## 2021-09-17 NOTE — Progress Notes (Signed)
Subjective:   Patient ID: Nicole Lowe, female   DOB: 46 y.o.   MRN: [id number]   HPI Patient states she is improving a lot and is back to walking approximately 3 miles a day with only minimal swelling.  Still taking her Mobic   ROS      Objective:  Physical Exam  Neurovascular status intact excellent range of motion of the first MPJ no crepitus of the joint no discomfort with incision site that is healed well minimal edema noted currently     Assessment:  Appears to be continuing to improve right with reduced inflammation no pain excellent range of motion     Plan:  H&P x-ray reviewed reviewed reviewed continued Mobic usage continued supportive shoes possibility for orthotics and will reappoint again in 6 weeks for final visit.  X-rays indicate there has been some secondary healing of the osteotomy site but it is stable has not moved at all joint congruence fixation in place

## 2021-10-29 ENCOUNTER — Encounter: Payer: Self-pay | Admitting: Podiatry

## 2021-10-29 ENCOUNTER — Ambulatory Visit: Payer: Commercial Managed Care - HMO | Admitting: Podiatry

## 2021-10-29 ENCOUNTER — Ambulatory Visit (INDEPENDENT_AMBULATORY_CARE_PROVIDER_SITE_OTHER): Payer: Commercial Managed Care - HMO

## 2021-10-29 DIAGNOSIS — Z9889 Other specified postprocedural states: Secondary | ICD-10-CM

## 2021-10-29 NOTE — Progress Notes (Signed)
Subjective:   Patient ID: Nicole Lowe, female   DOB: 46 y.o.   MRN: [id number]   HPI Patient states she is doing much better has returned to her normal activity and just gets occasional aching in her foot after prolonged activity   ROS      Objective:  Physical Exam  Neuro vascular status intact negative Bevelyn Buckles' sign noted right first MPJ of her healed well excellent range of motion no crepitus of the joint or muscle strength loss     Assessment:  Doing better after osteotomy right first metatarsal with slight secondary bone healing but appears stable     Plan:  H&P final x-ray reviewed allowed patient to return to all normal activity explained that aching is a part of the postoperative process but should continue to reduce.  Patient is encouraged to call questions concerns which may arise  X-rays indicate there is some secondary bone healing around the first metatarsal with the joint looking congruous and in good position with fixation in place.  Should heal uneventfully

## 2021-11-02 ENCOUNTER — Other Ambulatory Visit: Payer: Self-pay | Admitting: Podiatry

## 2022-11-23 ENCOUNTER — Other Ambulatory Visit: Payer: Self-pay | Admitting: Family Medicine

## 2022-11-23 ENCOUNTER — Other Ambulatory Visit (HOSPITAL_COMMUNITY)
Admission: RE | Admit: 2022-11-23 | Discharge: 2022-11-23 | Disposition: A | Discharge status: Home / Self Care | Payer: Commercial Managed Care - HMO | Source: Ambulatory Visit | Attending: Family Medicine | Admitting: Family Medicine

## 2022-11-23 DIAGNOSIS — Z01419 Encounter for gynecological examination (general) (routine) without abnormal findings: Secondary | ICD-10-CM | POA: Diagnosis present

## 2022-11-30 LAB — CYTOLOGY - PAP
Comment: NEGATIVE
Diagnosis: NEGATIVE
High risk HPV: NEGATIVE

## 2022-12-18 ENCOUNTER — Other Ambulatory Visit (HOSPITAL_COMMUNITY): Payer: Self-pay | Admitting: Student

## 2022-12-18 DIAGNOSIS — R748 Abnormal levels of other serum enzymes: Secondary | ICD-10-CM

## 2022-12-23 ENCOUNTER — Encounter (HOSPITAL_BASED_OUTPATIENT_CLINIC_OR_DEPARTMENT_OTHER): Payer: Self-pay

## 2022-12-23 ENCOUNTER — Ambulatory Visit (HOSPITAL_BASED_OUTPATIENT_CLINIC_OR_DEPARTMENT_OTHER): Payer: Commercial Managed Care - HMO

## 2023-01-14 ENCOUNTER — Other Ambulatory Visit (HOSPITAL_COMMUNITY): Payer: Self-pay | Admitting: Family Medicine

## 2023-01-14 DIAGNOSIS — E782 Mixed hyperlipidemia: Secondary | ICD-10-CM

## 2023-01-15 ENCOUNTER — Ambulatory Visit
Admission: RE | Admit: 2023-01-15 | Discharge: 2023-01-15 | Disposition: A | Discharge status: Home / Self Care | Payer: Self-pay | Source: Ambulatory Visit | Attending: Family Medicine | Admitting: Family Medicine

## 2023-01-15 DIAGNOSIS — E782 Mixed hyperlipidemia: Secondary | ICD-10-CM | POA: Insufficient documentation
